# Patient Record
Sex: Female | Born: 2000 | Race: White | Hispanic: No | Marital: Single | Smoking: Never smoker
Health system: Southern US, Community
[De-identification: ages and names within clinical notes are randomized; demographics above are authoritative.]

---
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
## 2021-06-28 DIAGNOSIS — L731 Pseudofolliculitis barbae: Secondary | ICD-10-CM | POA: Diagnosis not present

## 2021-06-28 DIAGNOSIS — L02214 Cutaneous abscess of groin: Secondary | ICD-10-CM | POA: Diagnosis not present

## 2021-12-01 DIAGNOSIS — H5213 Myopia, bilateral: Secondary | ICD-10-CM | POA: Diagnosis not present

## 2022-12-07 DIAGNOSIS — H5213 Myopia, bilateral: Secondary | ICD-10-CM | POA: Diagnosis not present

## 2023-03-22 ENCOUNTER — Encounter: Payer: Self-pay | Admitting: Family Medicine

## 2023-03-28 NOTE — Progress Notes (Signed)
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 Patient had a an episode on January 19th while she was tumbling and hit her head on another cheerleaders shoulder. 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 Patient still reports headaches and fugue like state. Patient has had no vision changes, N/V or other concerns. Patient denies any headaches that wake her up from sleep or any other concerns at this time.   Assessment and Plan:   Concussion  8th diagnosed concussion, patient symptoms are improving with minimal OTC meds. Given improvement in symptoms, would continue with concussion protocol as is. Patient to be withdrawn for activity until cleared from concussion protocol and symptom free.  Comprehensive Musculoskeletal Exam:    Comprehensive Neuroexam WNL  Imaging:      Brenton Grills MD Primary Care Sports Medicine Fellow [facility]  This document was dictated using Dragon voice recognition software. A reasonable attempt at proof reading has been made to minimize errors.

## 2023-03-29 ENCOUNTER — Encounter (HOSPITAL_COMMUNITY): Payer: Self-pay | Admitting: Family Medicine

## 2023-04-02 NOTE — Progress Notes (Signed)
  [employer] Training Room Note  History of Present Illness:   Marilyn Kirk is a 23 y.o. female for follow-up concussion.  Patient has been moving through concussion protocol well, does note some mild headaches whenever she is jogging/on the bike however symptom score is down to a 6.  Patient doing well and has no other concerns at this time and has not needed any medication for her headaches.   Assessment and Plan:   Concussion  Continuing return to play protocol, can return when symptom-free.  No need for further follow-up unless any new symptoms occur.   Comprehensive Musculoskeletal Exam:    Comprehensive neuroexam within normal limits  Imaging:     Brenton Grills MD Primary Care Sports Medicine Fellow [facility]  This document was dictated using Dragon voice recognition software. A reasonable attempt at proof reading has been made to minimize errors.

## 2023-11-23 DIAGNOSIS — M26601 Right temporomandibular joint disorder, unspecified: Secondary | ICD-10-CM | POA: Diagnosis not present

## 2023-12-19 DIAGNOSIS — H5213 Myopia, bilateral: Secondary | ICD-10-CM | POA: Diagnosis not present
